# Patient Record
Sex: Male | Born: 1970 | Race: White | Hispanic: No | Marital: Married | Smoking: Current some day smoker
Health system: Southern US, Community
[De-identification: ages and names within clinical notes are randomized; demographics above are authoritative.]

## PROBLEM LIST (undated history)

## (undated) DIAGNOSIS — K219 Gastro-esophageal reflux disease without esophagitis: Secondary | ICD-10-CM

---

## 2008-09-08 ENCOUNTER — Ambulatory Visit: Payer: Self-pay | Admitting: Gastroenterology

## 2012-08-21 ENCOUNTER — Ambulatory Visit: Payer: Self-pay | Admitting: Family Medicine

## 2012-08-22 ENCOUNTER — Emergency Department: Payer: Self-pay | Admitting: Emergency Medicine

## 2012-08-29 LAB — PSA

## 2012-09-21 ENCOUNTER — Ambulatory Visit: Payer: Self-pay | Admitting: Medical

## 2013-05-11 ENCOUNTER — Ambulatory Visit: Payer: Self-pay | Admitting: Internal Medicine

## 2014-02-11 ENCOUNTER — Ambulatory Visit: Payer: Self-pay | Admitting: Physician Assistant

## 2014-02-11 LAB — RAPID INFLUENZA A&B ANTIGENS (ARMC ONLY)

## 2014-10-31 ENCOUNTER — Ambulatory Visit: Payer: Self-pay

## 2014-10-31 LAB — COMPREHENSIVE METABOLIC PANEL
Albumin: 4.3 g/dL (ref 3.4–5.0)
Alkaline Phosphatase: 95 U/L
Anion Gap: 9 (ref 7–16)
BUN: 10 mg/dL (ref 7–18)
Bilirubin,Total: 0.5 mg/dL (ref 0.2–1.0)
CO2: 30 mmol/L (ref 21–32)
Calcium, Total: 9.2 mg/dL (ref 8.5–10.1)
Chloride: 99 mmol/L (ref 98–107)
Creatinine: 1.18 mg/dL (ref 0.60–1.30)
EGFR (African American): >60
GLUCOSE: 101 mg/dL — AB (ref 65–99)
Osmolality: 275 (ref 275–301)
Potassium: 4.3 mmol/L (ref 3.5–5.1)
SGOT(AST): 27 U/L (ref 15–37)
SGPT (ALT): 56 U/L
SODIUM: 138 mmol/L (ref 136–145)
TOTAL PROTEIN: 8.2 g/dL (ref 6.4–8.2)

## 2014-10-31 LAB — CBC WITH DIFFERENTIAL/PLATELET
BASOS ABS: 0.1 10*3/uL (ref 0.0–0.1)
Basophil %: 1 %
EOS ABS: 0.1 10*3/uL (ref 0.0–0.7)
Eosinophil %: 1.1 %
HCT: 49.5 % (ref 40.0–52.0)
HGB: 16.7 g/dL (ref 13.0–18.0)
LYMPHS ABS: 2.8 10*3/uL (ref 1.0–3.6)
LYMPHS PCT: 23.9 %
MCH: 29.4 pg (ref 26.0–34.0)
MCHC: 33.7 g/dL (ref 32.0–36.0)
MCV: 87 fL (ref 80–100)
MONO ABS: 1 x10 3/mm (ref 0.2–1.0)
Monocyte %: 8.4 %
NEUTROS ABS: 7.6 10*3/uL — AB (ref 1.4–6.5)
NEUTROS PCT: 65.6 %
Platelet: 270 10*3/uL (ref 150–440)
RBC: 5.67 10*6/uL (ref 4.40–5.90)
RDW: 12.7 % (ref 11.5–14.5)
WBC: 11.5 10*3/uL — ABNORMAL HIGH (ref 3.8–10.6)

## 2014-10-31 LAB — CK: CK, Total: 98 U/L (ref 39–308)

## 2014-10-31 LAB — CK-MB: CK-MB: 0.8 ng/mL (ref 0.5–3.6)

## 2014-10-31 LAB — D-DIMER(ARMC): D-Dimer: 167 ng/ml

## 2015-05-19 ENCOUNTER — Telehealth: Payer: Self-pay

## 2015-06-27 ENCOUNTER — Other Ambulatory Visit: Payer: Self-pay | Admitting: Unknown Physician Specialty

## 2015-08-04 ENCOUNTER — Telehealth: Payer: Self-pay | Admitting: Unknown Physician Specialty

## 2015-08-04 ENCOUNTER — Other Ambulatory Visit: Payer: Self-pay | Admitting: Unknown Physician Specialty

## 2015-08-04 MED ORDER — CITALOPRAM HYDROBROMIDE 20 MG PO TABS: 20.0000 mg | ORAL_TABLET | Freq: Every day | ORAL | Status: DC

## 2015-08-04 NOTE — Telephone Encounter (Signed)
Routing to provider. Patient scheduled appointment for 09/03/15 and pharmacy is [facility].

## 2015-08-04 NOTE — Telephone Encounter (Signed)
Pt needs refill on celexa and would like it go to [facility]. rx refill was denied because he needed to be seen first and he has an appt scheduled for the 30th he had to have it on a Friday morning and that was the closest i had.

## 2015-09-02 DIAGNOSIS — K219 Gastro-esophageal reflux disease without esophagitis: Secondary | ICD-10-CM | POA: Insufficient documentation

## 2015-09-03 ENCOUNTER — Encounter: Payer: Self-pay | Admitting: Unknown Physician Specialty

## 2015-09-03 ENCOUNTER — Ambulatory Visit (INDEPENDENT_AMBULATORY_CARE_PROVIDER_SITE_OTHER): Payer: BLUE CROSS/BLUE SHIELD | Admitting: Unknown Physician Specialty

## 2015-09-03 VITALS — BP 142/88 | HR 72 | Temp 97.8°F | Ht 70.7 in | Wt 219.6 lb

## 2015-09-03 DIAGNOSIS — G4700 Insomnia, unspecified: Secondary | ICD-10-CM | POA: Diagnosis not present

## 2015-09-03 DIAGNOSIS — G4730 Sleep apnea, unspecified: Secondary | ICD-10-CM

## 2015-09-03 DIAGNOSIS — F419 Anxiety disorder, unspecified: Secondary | ICD-10-CM | POA: Diagnosis not present

## 2015-09-03 MED ORDER — CITALOPRAM HYDROBROMIDE 20 MG PO TABS: 20.0000 mg | ORAL_TABLET | Freq: Every day | ORAL | Status: AC

## 2015-09-03 NOTE — Assessment & Plan Note (Signed)
Will send for sleep study.    

## 2015-09-03 NOTE — Progress Notes (Signed)
BP 142/88 mmHg  Pulse 72  Temp(Src) 97.8 F (36.6 C)  Ht 5' 10.7" (1.796 m)  Wt 219 lb 9.6 oz (99.61 kg)  BMI 30.88 kg/m2  SpO2 100%   Subjective:    Patient ID: Luke Cole, male    DOB: 09-Oct-1971, 44 y.o.   MRN: [id number]  HPI: Luke Cole is a 44 y.o. male  Chief Complaint  Patient presents with  . Medication Refill    pt states he needs refill on citalopram  . OTHER    pt states he would like to talk about having a sleep study done   Anxiety: He is has been taking citalopram for about 6 months and feels this is working. GAD 7 is 5. He feels more anxious when he is around larger groups (generally family) and would like to have something for PRN use.  He is compliant with medication and denies missing any doses.   Insomnia: He is not sleeping well. He will sleep for an hour then get up for a few hours before going back to bed. He is told he snores when sleeping and his family would like him to have a sleep study.  Falls asleep if not moving.  Does feel rested in the AM.  Does not have headaches.    Relevant past medical, surgical, family and social history reviewed and updated as indicated. Interim medical history since our last visit reviewed. Allergies and medications reviewed and updated.  Depression screen PHQ 2/9 09/03/2015  Decreased Interest 0  Down, Depressed, Hopeless 0  PHQ - 2 Score 0    Review of Systems  Constitutional: Negative for fever, chills, activity change, appetite change and fatigue.  HENT: Negative for congestion, postnasal drip, rhinorrhea, sinus pressure and sore throat.   Eyes: Negative.  Negative for discharge and redness.  Respiratory: Negative.  Negative for cough, chest tightness and shortness of breath.   Cardiovascular: Negative.  Negative for chest pain, palpitations and leg swelling.  Gastrointestinal: Negative.  Negative for diarrhea and constipation.  Musculoskeletal: Negative.  Negative for myalgias, back  pain, arthralgias and gait problem.  Skin: Negative.  Negative for color change, pallor, rash and wound.  Neurological: Negative for dizziness, light-headedness and headaches.  Psychiatric/Behavioral: Negative.  Negative for confusion. The patient is not nervous/anxious.     Per HPI unless specifically indicated above     Objective:    BP 142/88 mmHg  Pulse 72  Temp(Src) 97.8 F (36.6 C)  Ht 5' 10.7" (1.796 m)  Wt 219 lb 9.6 oz (99.61 kg)  BMI 30.88 kg/m2  SpO2 100%  Wt Readings from Last 3 Encounters:  09/03/15 219 lb 9.6 oz (99.61 kg)  03/19/15 207 lb (93.895 kg)    Physical Exam  Constitutional: He is oriented to person, place, and time. He appears well-developed and well-nourished. No distress.  HENT:  Head: Normocephalic and atraumatic.  Eyes: Conjunctivae are normal.  Neck: Normal range of motion.  Cardiovascular: Normal rate, regular rhythm and normal heart sounds.  Exam reveals no gallop and no friction rub.   No murmur heard. Pulmonary/Chest: Effort normal and breath sounds normal. No respiratory distress. He has no wheezes. He has no rales. He exhibits no tenderness.  Musculoskeletal: Normal range of motion. He exhibits no edema or tenderness.  Neurological: He is alert and oriented to person, place, and time.  Skin: Skin is warm and dry. No rash noted. He is not diaphoretic. No erythema. No pallor.  Psychiatric: He has  a normal mood and affect. His behavior is normal. Judgment and thought content normal.        Assessment & Plan:   Problem List Items Addressed This Visit      Unprioritized   Insomnia - Primary    Will send for sleep study      Anxiety   Relevant Medications   citalopram (CELEXA) 20 MG tablet    Other Visit Diagnoses    Sleep apnea        Relevant Orders    Ambulatory referral to Sleep Studies        Follow up plan: Return for schedule physical.

## 2015-10-25 ENCOUNTER — Encounter: Payer: Self-pay | Admitting: Unknown Physician Specialty

## 2015-10-25 DIAGNOSIS — G4730 Sleep apnea, unspecified: Secondary | ICD-10-CM | POA: Insufficient documentation

## 2015-11-09 NOTE — Telephone Encounter (Signed)
done

## 2015-12-10 ENCOUNTER — Encounter: Payer: BLUE CROSS/BLUE SHIELD | Admitting: Unknown Physician Specialty

## 2016-02-19 ENCOUNTER — Encounter: Payer: Self-pay | Admitting: Emergency Medicine

## 2016-02-19 ENCOUNTER — Emergency Department: Payer: BLUE CROSS/BLUE SHIELD

## 2016-02-19 ENCOUNTER — Emergency Department
Admission: EM | Admit: 2016-02-19 | Discharge: 2016-02-20 | Disposition: A | Discharge status: Home / Self Care | Payer: BLUE CROSS/BLUE SHIELD | Attending: Emergency Medicine | Admitting: Emergency Medicine

## 2016-02-19 DIAGNOSIS — Z79899 Other long term (current) drug therapy: Secondary | ICD-10-CM | POA: Insufficient documentation

## 2016-02-19 DIAGNOSIS — R451 Restlessness and agitation: Secondary | ICD-10-CM | POA: Insufficient documentation

## 2016-02-19 DIAGNOSIS — F17200 Nicotine dependence, unspecified, uncomplicated: Secondary | ICD-10-CM | POA: Diagnosis not present

## 2016-02-19 DIAGNOSIS — R45851 Suicidal ideations: Secondary | ICD-10-CM | POA: Diagnosis not present

## 2016-02-19 DIAGNOSIS — Z046 Encounter for general psychiatric examination, requested by authority: Secondary | ICD-10-CM | POA: Diagnosis present

## 2016-02-19 DIAGNOSIS — F4324 Adjustment disorder with disturbance of conduct: Secondary | ICD-10-CM

## 2016-02-19 DIAGNOSIS — F1020 Alcohol dependence, uncomplicated: Secondary | ICD-10-CM

## 2016-02-19 HISTORY — DX: Gastro-esophageal reflux disease without esophagitis: K21.9

## 2016-02-19 LAB — COMPREHENSIVE METABOLIC PANEL
ALT: 33 U/L (ref 17–63)
AST: 29 U/L (ref 15–41)
Albumin: 4.8 g/dL (ref 3.5–5.0)
Alkaline Phosphatase: 65 U/L (ref 38–126)
Anion gap: 4 — ABNORMAL LOW (ref 5–15)
BILIRUBIN TOTAL: 0.7 mg/dL (ref 0.3–1.2)
BUN: 8 mg/dL (ref 6–20)
CHLORIDE: 109 mmol/L (ref 101–111)
CO2: 27 mmol/L (ref 22–32)
Calcium: 10.2 mg/dL (ref 8.9–10.3)
Creatinine, Ser: 0.82 mg/dL (ref 0.61–1.24)
Glucose, Bld: 107 mg/dL — ABNORMAL HIGH (ref 65–99)
POTASSIUM: 4.2 mmol/L (ref 3.5–5.1)
Sodium: 140 mmol/L (ref 135–145)
TOTAL PROTEIN: 8 g/dL (ref 6.5–8.1)

## 2016-02-19 LAB — CBC
HCT: 46.3 % (ref 40.0–52.0)
Hemoglobin: 15.8 g/dL (ref 13.0–18.0)
MCH: 30.2 pg (ref 26.0–34.0)
MCHC: 34.2 g/dL (ref 32.0–36.0)
MCV: 88.2 fL (ref 80.0–100.0)
PLATELETS: 279 10*3/uL (ref 150–440)
RBC: 5.25 MIL/uL (ref 4.40–5.90)
RDW: 13.2 % (ref 11.5–14.5)
WBC: 8.3 10*3/uL (ref 3.8–10.6)

## 2016-02-19 LAB — URINE DRUG SCREEN, QUALITATIVE (ARMC ONLY)
AMPHETAMINES, UR SCREEN: NOT DETECTED
BENZODIAZEPINE, UR SCRN: NOT DETECTED
Barbiturates, Ur Screen: NOT DETECTED
Cannabinoid 50 Ng, Ur ~~LOC~~: NOT DETECTED
Cocaine Metabolite,Ur ~~LOC~~: NOT DETECTED
MDMA (ECSTASY) UR SCREEN: NOT DETECTED
Methadone Scn, Ur: NOT DETECTED
Opiate, Ur Screen: NOT DETECTED
PHENCYCLIDINE (PCP) UR S: NOT DETECTED
TRICYCLIC, UR SCREEN: NOT DETECTED

## 2016-02-19 LAB — ETHANOL

## 2016-02-19 LAB — ACETAMINOPHEN LEVEL: Acetaminophen (Tylenol), Serum: <10 ug/mL — ABNORMAL LOW (ref 10–30)

## 2016-02-19 LAB — SALICYLATE LEVEL

## 2016-02-19 NOTE — ED Notes (Signed)
Patient transported to X-ray 

## 2016-02-19 NOTE — ED Notes (Signed)
Patient presents to the ED with police under involuntary commitment.  Patient got upset earlier today when speaking to his wife, showed her a gun, and told her that, "with one pull he could end his life."  When this RN asked patient why he is in the ED today patient states, "I guess I'm mental."  When asked about SI, patient states, "I guess."  Patient's wife states patient has been under a large amount of stress lately.

## 2016-02-19 NOTE — ED Provider Notes (Signed)
[facility] Emergency Department Provider Note  ____________________________________________  Time seen: Approximately 6:30 PM  I have reviewed the triage vital signs and the nursing notes.   HISTORY  Chief Complaint Psychiatric Evaluation    HPI Luke Cole is a 45 y.o. male reports that he went out tricking last night, he got an argument with his wife this morning and then went to his truck and walked out with a right full between there and the house when his wife called 911. Reports he is having some suicidal thoughts. Denies trying or wanting to harm anyone else, though he was upset with his wife.  He does report he uses alcohol occasionally, none today. No history of withdrawals and is not a daily drinker but drank a fair amount last night.  Denies any recent illness except for a slight cough which she's had for 3 weeks without fever. Nonproductive.Denies shortness of breath.   Past Medical History  Diagnosis Date  . GERD (gastroesophageal reflux disease)     Patient Active Problem List   Diagnosis Date Noted  . Sleep apnea 10/25/2015  . Insomnia 09/03/2015  . Anxiety 09/03/2015  . GERD (gastroesophageal reflux disease) 09/02/2015    History reviewed. No pertinent past surgical history.  Current Outpatient Rx  Name  Route  Sig  Dispense  Refill  . citalopram (CELEXA) 20 MG tablet   Oral   Take 1 tablet (20 mg total) by mouth daily.   90 tablet   3   . esomeprazole (NEXIUM) 40 MG capsule   Oral   Take 40 mg by mouth daily at 12 noon.           Allergies Bactrim  Family History  Problem Relation Age of Onset  . Arthritis Father   . Heart disease Father     Social History Social History  Substance Use Topics  . Smoking status: Current Some Day Smoker  . Smokeless tobacco: Current User  . Alcohol Use: 4.2 oz/week    7 Cans of beer, 0 Standard drinks or equivalent per week    Review of Systems Constitutional:  No fever/chills Eyes: No visual changes. ENT: No sore throat. Cardiovascular: Denies chest pain. Respiratory: Denies shortness of breath. Has had a cough for 3 weeks, nonproductive. These could be from smoking. Gastrointestinal: No abdominal pain.  No nausea, no vomiting.  No diarrhea.  No constipation. Genitourinary: Negative for dysuria. Musculoskeletal: Negative for back pain. Skin: Negative for rash. Neurological: Negative for headaches, focal weakness or numbness.  10-point ROS otherwise negative.  ____________________________________________   PHYSICAL EXAM:  VITAL SIGNS: ED Triage Vitals  Enc Vitals Group     BP 02/19/16 1733 137/95 mmHg     Pulse Rate 02/19/16 1733 82     Resp 02/19/16 1733 18     Temp 02/19/16 1733 98.7 F (37.1 C)     Temp Source 02/19/16 1733 Oral     SpO2 02/19/16 1733 98 %     Weight 02/19/16 1733 195 lb (88.451 kg)     Height 02/19/16 1733 6' (1.829 m)     Head Cir --      Peak Flow --      Pain Score 02/19/16 1734 0     Pain Loc --      Pain Edu? --      Excl. in GC? --    Constitutional: Alert and oriented. Well appearing and in no acute distress. Eyes: Conjunctivae are normal. PERRL. EOMI. Head:  Atraumatic. Nose: No congestion/rhinnorhea. Mouth/Throat: Mucous membranes are moist.  Oropharynx non-erythematous. Neck: No stridor.   Cardiovascular: Normal rate, regular rhythm. Grossly normal heart sounds.  Good peripheral circulation. Respiratory: Normal respiratory effort.  No retractions. Lungs CTAB. Gastrointestinal: Soft and nontender. No distention.  Musculoskeletal: No lower extremity tenderness nor edema.  No joint effusions. Neurologic:  Normal speech and language. No gross focal neurologic deficits are appreciated. No gait instability. Skin:  Skin is warm, dry and intact. No rash noted. Psychiatric: Mood and affect are slightly agitated. Speech and behavior are normal.  ____________________________________________    LABS (all labs ordered are listed, but only abnormal results are displayed)  Labs Reviewed  COMPREHENSIVE METABOLIC PANEL - Abnormal; Notable for the following:    Glucose, Bld 107 (*)    Anion gap 4 (*)    All other components within normal limits  ACETAMINOPHEN LEVEL - Abnormal; Notable for the following:    Acetaminophen (Tylenol), Serum <10 (*)    All other components within normal limits  ETHANOL  SALICYLATE LEVEL  CBC  URINE DRUG SCREEN, QUALITATIVE ([facility] ONLY)   ____________________________________________  EKG   ____________________________________________  RADIOLOGY  DG Chest 2 View (Final result) Result time: 02/19/16 19:47:26   Final result by Rad Results In Interface (02/19/16 19:47:26)   Narrative:   CLINICAL DATA: Cough for 3 weeks  EXAM: CHEST 2 VIEW  COMPARISON: 10/31/2014  FINDINGS: The heart size and vascular pattern are normal. Lungs are clear. No pleural effusion.  IMPRESSION: No active cardiopulmonary disease.   Electronically Signed By: Esperanza Heiraymond Rubner M.D. On: 02/19/2016 19:47       ____________________________________________   PROCEDURES  Procedure(s) performed: None  Critical Care performed: No  ____________________________________________   INITIAL IMPRESSION / ASSESSMENT AND PLAN / ED COURSE  Pertinent labs & imaging results that were available during my care of the patient were reviewed by me and considered in my medical decision making (see chart for details).  Patient presents for suicidal ideation. Denies attempt, but he was notably brandishing a weapon at some point with suicidal statements. He is on involuntary commitment. Medically he does complain of a slight cough, denies overdose or ingestion. His hemodynamics are stable. Lungs are clear. I will obtain chest x-ray to evaluate for pneumonia or cause of chronic cough,which is possibly due to smoking.  ----------------------------------------- 10:21  PM on 02/19/2016 -----------------------------------------  Patient medically cleared at this time, no evidence of acute emergent medical problem. He will remain on involuntary commitment, psychiatry consult pending at this time. Patient does report suicidal ideation. ____________________________________________   FINAL CLINICAL IMPRESSION(S) / ED DIAGNOSES  Final diagnoses:  Suicidal ideation      Sharyn CreamerMark Yates Weisgerber, MD 02/19/16 2221

## 2016-02-20 DIAGNOSIS — F17200 Nicotine dependence, unspecified, uncomplicated: Secondary | ICD-10-CM

## 2016-02-20 DIAGNOSIS — F4324 Adjustment disorder with disturbance of conduct: Secondary | ICD-10-CM | POA: Diagnosis not present

## 2016-02-20 DIAGNOSIS — F1020 Alcohol dependence, uncomplicated: Secondary | ICD-10-CM

## 2016-02-20 NOTE — BH Assessment (Signed)
Assessment Note  Luke Cole is an 45 y.o. male presenting to the the ED under IVC, initiated by his wife, Luke Cole [phone]).  Patient says he went out "tricking" last night and having a good time with his wife.  Pt stated he had too much to drink and wanted to talk to his wife about the events that happened last night.  Pt became very agitated because his wife did not want to talk to him.  Per the IVC, pt decided to leave and as he was leaving, pt showed his wife his gun and stated that with one pull he could end his life.  Pt admits to drinking too much but denies being suicidal or homicidal.  Pt denies any illicit drug use.  Diagnosis: Alcohol Intoxication  Past Medical History:  Past Medical History  Diagnosis Date  . GERD (gastroesophageal reflux disease)     History reviewed. No pertinent past surgical history.  Family History:  Family History  Problem Relation Age of Onset  . Arthritis Father   . Heart disease Father     Social History:  reports that he has been smoking.  He uses smokeless tobacco. He reports that he drinks about 4.2 oz of alcohol per week. He reports that he does not use illicit drugs.  Additional Social History:  Alcohol / Drug Use History of alcohol / drug use?: No history of alcohol / drug abuse  CIWA: CIWA-Ar BP: 101/60 mmHg Pulse Rate: 70 COWS:    Allergies:  Allergies  Allergen Reactions  . Bactrim [Sulfamethoxazole-Trimethoprim] Rash    Home Medications:  (Not in a hospital admission)  OB/GYN Status:  No LMP for male patient.  General Assessment Data [facility] Assessment: In system Is this a Tele or Face-to-Face Assessment?: Face-to-Face Is this an Initial Assessment or a Re-assessment for this encounter?: Initial Assessment Marital status: Married Taylor MillMaiden name: N/A Is patient pregnant?: No Pregnancy Status: No Living Arrangements: Spouse/significant other Can pt return to current  living arrangement?: Yes Admission Status: Involuntary Is patient capable of signing voluntary admission?: No Referral Source: Self/Family/Friend Insurance type: BCBS     Crisis Care Plan Living Arrangements: Spouse/significant other Legal Guardian: Other: (self) Name of Psychiatrist: None reported Name of Therapist: None reported  Education Status Is patient currently in school?: No Current Grade: N/A Highest grade of school patient has completed: 12th Name of school: N/A Contact person: N/A  Risk to self with the past 6 months Suicidal Ideation: Yes-Currently Present Has patient been a risk to self within the past 6 months prior to admission? : No Suicidal Intent: No Has patient had any suicidal intent within the past 6 months prior to admission? : No Is patient at risk for suicide?: No Suicidal Plan?: No (Pt denies) Has patient had any suicidal plan within the past 6 months prior to admission? : No Access to Means: Yes Specify Access to Suicidal Means: Patient has a gun What has been your use of drugs/alcohol within the last 12 months?: ETOH Previous Attempts/Gestures: No How many times?: 0 Other Self Harm Risks: None reported Triggers for Past Attempts: Unknown Intentional Self Injurious Behavior: None Family Suicide History: No Recent stressful life event(s): Conflict (Comment) (Conflict with wife) Persecutory voices/beliefs?: No Depression: No Substance abuse history and/or treatment for substance abuse?: No Suicide prevention information given to non-admitted patients: Not applicable  Risk to Others within the past 6 months Homicidal Ideation: No Does patient have any lifetime risk of violence  toward others beyond the six months prior to admission? : No Thoughts of Harm to Others: No Current Homicidal Intent: No Current Homicidal Plan: No Access to Homicidal Means: No Identified Victim: None identified History of harm to others?: No Assessment of Violence:  None Noted Violent Behavior Description: None identified Does patient have access to weapons?: Yes (Comment) Criminal Charges Pending?: No Does patient have a court date: No Is patient on probation?: No  Psychosis Hallucinations: None noted Delusions: None noted  Mental Status Report Appearance/Hygiene: In scrubs Eye Contact: Fair Motor Activity: Freedom of movement Speech: Slurred Level of Consciousness: Drowsy Mood: Ambivalent Affect: Appropriate to circumstance Anxiety Level: None Thought Processes: Coherent, Relevant Judgement: Partial Orientation: Person, Place, Time, Situation Obsessive Compulsive Thoughts/Behaviors: None  Cognitive Functioning Concentration: Normal Memory: Recent Intact, Remote Intact IQ: Average Insight: Fair Impulse Control: Fair Appetite: Fair Weight Loss: 0 Weight Gain: 0 Sleep: No Change Total Hours of Sleep: 8 Vegetative Symptoms: None  ADLScreening [facility] Assessment Services) Patient's cognitive ability adequate to safely complete daily activities?: Yes Patient able to express need for assistance with ADLs?: Yes Independently performs ADLs?: Yes (appropriate for developmental age)  Prior Inpatient Therapy Prior Inpatient Therapy: No Prior Therapy Dates: N/A Prior Therapy Facilty/Provider(s): N/A Reason for Treatment: N/A  Prior Outpatient Therapy Prior Outpatient Therapy: No Prior Therapy Dates: N/A Prior Therapy Facilty/Provider(s): N/A Reason for Treatment: N/A Does patient have an ACCT team?: No Does patient have Intensive In-House Services?  : No Does patient have [facility] services? : No Does patient have P4CC services?: No  ADL Screening (condition at time of admission) Patient's cognitive ability adequate to safely complete daily activities?: Yes Patient able to express need for assistance with ADLs?: Yes Independently performs ADLs?: Yes (appropriate for developmental age)       Abuse/Neglect Assessment (Assessment  to be complete while patient is alone) Physical Abuse: Denies Verbal Abuse: Denies Sexual Abuse: Denies Exploitation of patient/patient's resources: Denies Self-Neglect: Denies Values / Beliefs Cultural Requests During Hospitalization: None Spiritual Requests During Hospitalization: None Consults Spiritual Care Consult Needed: No Social Work Consult Needed: No Merchant navy officer (For Healthcare) Does patient have an advance directive?: No Would patient like information on creating an advanced directive?: No - patient declined information    Additional Information 1:1 In Past 12 Months?: No CIRT Risk: No Elopement Risk: No Does patient have medical clearance?: Yes     Disposition:  Disposition Initial Assessment Completed for this Encounter: Yes Disposition of Patient: Other dispositions Other disposition(s): Other (Comment) (Psych MD consult)  On Site Evaluation by:   Reviewed with Physician:    [place] 02/20/2016 12:38 AM

## 2016-02-20 NOTE — Consult Note (Signed)
[facility] Consult   Reason for Consult:  SI Referring Physician:  ER Patient Identification: Luke Cole MRN:  [id number] Principal Diagnosis: Adjustment disorder with disturbance of conduct Diagnosis:   Patient Active Problem List   Diagnosis Date Noted  . Alcohol use disorder, severe, dependence ([place]) [F10.20] 02/20/2016  . Tobacco use disorder [F17.200] 02/20/2016  . Adjustment disorder with disturbance of conduct [F43.24] 02/20/2016  . Sleep apnea [G47.30] 10/25/2015  . Insomnia [G47.00] 09/03/2015  . GERD (gastroesophageal reflux disease) [K21.9] 09/02/2015    Total Time spent with patient: 1 hour  Subjective:   Luke Cole is a 45 y.o. male patient admitted with SI.  HPI:   [name] is an 45 y.o. male presenting to the the ED under IVC, initiated by his wife, Luke Cole [phone]). Patient says he went out "tricking" last night and having a good time with his wife. Pt stated he had too much to drink and wanted to talk to his wife about the events that happened last night. Pt became very agitated because his wife did not want to talk to him. Per the IVC, pt decided to leave and as he was leaving, pt showed his wife his gun and stated that with one pull he could end his life. Pt admits to drinking too much but denies being suicidal or homicidal. Pt denies any illicit drug use.  At arrival to ER alcohol level was below detection and u tox was neg.  Today he reports he is having issues with his mood which he attributes to alcohol use.  He denies symptoms of MDD as he says he does well when not drinking.  He wants to address his issues with alcohol and is requesting outpt treatment for it.  Pt says that his wife is also drinking heavealy and they are frequently arguing. Patient denies any desire to harm himself or anyone else.  Denies hallucinations or having h/o aggression. Nurse spoke with wife she confriems all guns  were removed from the house.  Family is supportive of him and they don't have any concerns about his safety if he were discharged today.    Pt says his wife also takes pain medication as she recently had a back injury.  Substance abuse: drinks half a pint 3 times per week.  Smokes a few times per week.  Denies any h/o withdrawal from alcohol.  Denies use of any other substances or prescription medications.   Past Psychiatric History: pt reports 1 prior hospitalization at age 73 for an OD.  No psychiatric treatment after that.  He saw his PCP for mood instability and was prescribed with citalopram.  Pt reports no improvement with citalopram.  Does not want to continue taking it.   Risk to Self: Suicidal Ideation: Yes-Currently Present Suicidal Intent: No Is patient at risk for suicide?: No Suicidal Plan?: No (Pt denies) Access to Means: Yes Specify Access to Suicidal Means: Patient has a gun What has been your use of drugs/alcohol within the last 12 months?: ETOH How many times?: 0 Other Self Harm Risks: None reported Triggers for Past Attempts: Unknown Intentional Self Injurious Behavior: None Risk to Others: Homicidal Ideation: No Thoughts of Harm to Others: No Current Homicidal Intent: No Current Homicidal Plan: No Access to Homicidal Means: No Identified Victim: None identified History of harm to others?: No Assessment of Violence: None Noted Violent Behavior Description: None identified Does patient have access to weapons?: Yes (Comment) Criminal Charges Pending?: No Does  patient have a court date: No Prior Inpatient Therapy: Prior Inpatient Therapy: No Prior Therapy Dates: N/A Prior Therapy Facilty/Provider(s): N/A Reason for Treatment: N/A Prior Outpatient Therapy: Prior Outpatient Therapy: No Prior Therapy Dates: N/A Prior Therapy Facilty/Provider(s): N/A Reason for Treatment: N/A Does patient have an ACCT team?: No Does patient have Intensive In-House Services?  :  No Does patient have [facility] services? : No Does patient have P4CC services?: No  Past Medical History: no history of head injury or seizures. Past Medical History  Diagnosis Date  . GERD (gastroesophageal reflux disease)    History reviewed. No pertinent past surgical history.  Family History:  Family History  Problem Relation Age of Onset  . Arthritis Father   . Heart disease Father    Family Psychiatric  History: alcoholism in his father and brother.  1st cousin recently committed suicide recently.  There were not closed.  Social History: lives with wife and two of their 3 children (2 minors and 1 adult). 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  Denies legal history.  College education History  Alcohol Use  . 4.2 oz/week  . 7 Cans of beer, 0 Standard drinks or equivalent per week     History  Drug Use No    Social History   Social History  . Marital Status: Married    Spouse Name: N/A  . Number of Children: N/A  . Years of Education: N/A   Social History Main Topics  . Smoking status: Current Some Day Smoker  . Smokeless tobacco: Current User  . Alcohol Use: 4.2 oz/week    7 Cans of beer, 0 Standard drinks or equivalent per week  . Drug Use: No  . Sexual Activity: Not Asked   Other Topics Concern  . None   Social History Narrative       Allergies:   Allergies  Allergen Reactions  . Bactrim [Sulfamethoxazole-Trimethoprim] Rash    Labs:  Results for orders placed or performed during the hospital encounter of 02/19/16 (from the past 48 hour(s))  Comprehensive metabolic panel     Status: Abnormal   Collection Time: 02/19/16  5:37 PM  Result Value Ref Range   Sodium 140 135 - 145 mmol/L   Potassium 4.2 3.5 - 5.1 mmol/L   Chloride 109 101 - 111 mmol/L   CO2 27 22 - 32 mmol/L   Glucose, Bld 107 (H) 65 - 99 mg/dL   BUN 8 6 - 20 mg/dL   Creatinine, Ser 0.82 0.61 - 1.24 mg/dL   Calcium 10.2 8.9 - 10.3 mg/dL   Total Protein  8.0 6.5 - 8.1 g/dL   Albumin 4.8 3.5 - 5.0 g/dL   AST 29 15 - 41 U/L   ALT 33 17 - 63 U/L   Alkaline Phosphatase 65 38 - 126 U/L   Total Bilirubin 0.7 0.3 - 1.2 mg/dL   GFR calc non Af Amer >60 >60 mL/min   GFR calc Af Amer >60 >60 mL/min    Comment: (NOTE) The eGFR has been calculated using the CKD EPI equation. This calculation has not been validated in all clinical situations. eGFR's persistently <60 mL/min signify possible Chronic Kidney Disease.    Anion gap 4 (L) 5 - 15  Ethanol (ETOH)     Status: None   Collection Time: 02/19/16  5:37 PM  Result Value Ref Range   Alcohol, Ethyl (B) <5 <5 mg/dL    Comment:  LOWEST DETECTABLE LIMIT FOR SERUM ALCOHOL IS 5 mg/dL FOR MEDICAL PURPOSES ONLY   Salicylate level     Status: None   Collection Time: 02/19/16  5:37 PM  Result Value Ref Range   Salicylate Lvl <4.1 2.8 - 30.0 mg/dL  Acetaminophen level     Status: Abnormal   Collection Time: 02/19/16  5:37 PM  Result Value Ref Range   Acetaminophen (Tylenol), Serum <10 (L) 10 - 30 ug/mL    Comment:        THERAPEUTIC CONCENTRATIONS VARY SIGNIFICANTLY. A RANGE OF 10-30 ug/mL MAY BE AN EFFECTIVE CONCENTRATION FOR MANY PATIENTS. HOWEVER, SOME ARE BEST TREATED AT CONCENTRATIONS OUTSIDE THIS RANGE. ACETAMINOPHEN CONCENTRATIONS >150 ug/mL AT 4 HOURS AFTER INGESTION AND >50 ug/mL AT 12 HOURS AFTER INGESTION ARE OFTEN ASSOCIATED WITH TOXIC REACTIONS.   CBC     Status: None   Collection Time: 02/19/16  5:37 PM  Result Value Ref Range   WBC 8.3 3.8 - 10.6 K/uL   RBC 5.25 4.40 - 5.90 MIL/uL   Hemoglobin 15.8 13.0 - 18.0 g/dL   HCT 46.3 40.0 - 52.0 %   MCV 88.2 80.0 - 100.0 fL   MCH 30.2 26.0 - 34.0 pg   MCHC 34.2 32.0 - 36.0 g/dL   RDW 13.2 11.5 - 14.5 %   Platelets 279 150 - 440 K/uL  Urine Drug Screen, Qualitative ([facility] only)     Status: None   Collection Time: 02/19/16  5:37 PM  Result Value Ref Range   Tricyclic, Ur Screen NONE DETECTED NONE DETECTED    Amphetamines, Ur Screen NONE DETECTED NONE DETECTED   MDMA (Ecstasy)Ur Screen NONE DETECTED NONE DETECTED   Cocaine Metabolite,Ur [place] NONE DETECTED NONE DETECTED   Opiate, Ur Screen NONE DETECTED NONE DETECTED   Phencyclidine (PCP) Ur S NONE DETECTED NONE DETECTED   Cannabinoid 50 Ng, Ur [place] NONE DETECTED NONE DETECTED   Barbiturates, Ur Screen NONE DETECTED NONE DETECTED   Benzodiazepine, Ur Scrn NONE DETECTED NONE DETECTED   Methadone Scn, Ur NONE DETECTED NONE DETECTED    Comment: (NOTE) 030  Tricyclics, urine               Cutoff 1000 ng/mL 200  Amphetamines, urine             Cutoff 1000 ng/mL 300  MDMA (Ecstasy), urine           Cutoff 500 ng/mL 400  Cocaine Metabolite, urine       Cutoff 300 ng/mL 500  Opiate, urine                   Cutoff 300 ng/mL 600  Phencyclidine (PCP), urine      Cutoff 25 ng/mL 700  Cannabinoid, urine              Cutoff 50 ng/mL 800  Barbiturates, urine             Cutoff 200 ng/mL 900  Benzodiazepine, urine           Cutoff 200 ng/mL 1000 Methadone, urine                Cutoff 300 ng/mL 1100 1200 The urine drug screen provides only a preliminary, unconfirmed 1300 analytical test result and should not be used for non-medical 1400 purposes. Clinical consideration and professional judgment should 1500 be applied to any positive drug screen result due to possible 1600 interfering substances. A more specific alternate chemical method 1700 must be used  in order to obtain a confirmed analytical result.  1800 Gas chromato graphy / mass spectrometry (GC/MS) is the preferred 1900 confirmatory method.     No current facility-administered medications for this encounter.   Current Outpatient Prescriptions  Medication Sig Dispense Refill  . citalopram (CELEXA) 20 MG tablet Take 1 tablet (20 mg total) by mouth daily. 90 tablet 3    Musculoskeletal: Strength & Muscle Tone: within normal limits Gait & Station: normal Patient leans: N/A  Psychiatric  Specialty Exam: Review of Systems  Constitutional: Negative.   HENT: Negative.   Eyes: Negative.   Respiratory: Negative.   Cardiovascular: Negative.   Gastrointestinal: Negative.   Genitourinary: Negative.   Musculoskeletal: Negative.   Skin: Negative.   Neurological: Negative.   Endo/Heme/Allergies: Negative.   Psychiatric/Behavioral: Positive for substance abuse.    Blood pressure 108/68, pulse 81, temperature 97.8 F (36.6 C), temperature source Oral, resp. rate 16, height 6' (1.829 m), weight 88.451 kg (195 lb), SpO2 97 %.Body mass index is 26.44 kg/(m^2).  General Appearance: Well Groomed  Engineer, water::  Good  Speech:  Clear and Coherent  Volume:  Normal  Mood:  Anxious  Affect:  Appropriate  Thought Process:  Linear  Orientation:  Full (Time, Place, and Person)  Thought Content:  Hallucinations: None  Suicidal Thoughts:  No  Homicidal Thoughts:  No  Memory:  Immediate;   Good Recent;   Good Remote;   Good  Judgement:  Fair  Insight:  Fair  Psychomotor Activity:  Normal  Concentration:  Good  Recall:  Good  Fund of Knowledge:Good  Language: Good  Akathisia:  No  Handed:    AIMS (if indicated):     Assets:  Agricultural consultant Housing Social Support  ADL's:  Intact  Cognition: WNL  Sleep:      Treatment Plan Summary: Daily contact with patient to assess and evaluate symptoms and progress in treatment and Medication management   Pt will be d/c back home SW has provided the pt with resources for him and his family for psychiatric and substance abuse treatment.  Disposition: No evidence of imminent risk to self or others at present.   Patient does not meet criteria for psychiatric inpatient admission.  Hildred Priest, MD 02/20/2016 10:48 AM

## 2016-02-20 NOTE — ED Provider Notes (Signed)
Patient cleared for discharge by Dr. Ardyth HarpsHernandez of psychiatry  Jene Everyobert Makensie Mulhall, MD 02/20/16 1115

## 2016-02-20 NOTE — Progress Notes (Signed)
LCSW met with patient. He reports that he understands he has mood swings and that when he drinks things become escalated. He has insight that he needs to quit drinking. Patient is agreeable to attend an outpatient SA program and the family  members will pick and chose the program that best meets their needs. LCSW created a booklet of resources, [facility], [employer] [facility], [facility], [facility] detox, suboxine Clinic and [place] [facility], counselling. LCSW provided AA and [facility] groups meeting lists for patient and family members. Patient will be discharged. No further needs.  [employer] LCSW [phone]

## 2016-02-20 NOTE — ED Notes (Signed)
Pt up eating breakfast  

## 2016-02-20 NOTE — ED Notes (Signed)
Patient discharged ambulatory to home. Denies SI or HI. Discharge instructions reviewed with patient, he verbalizes understanding. Patient received copy of DC plan and all personal belongings.

## 2016-02-20 NOTE — ED Notes (Signed)

## 2016-02-20 NOTE — ED Notes (Signed)
Patient denies SI or HI.  Patient spoke with wife on the phone. Patient reports conversation went well.  Patient took a shower.  Will continue to monitor q 15 minutes.

## 2016-02-20 NOTE — Discharge Instructions (Signed)
Suicidal Feelings: How to Help Yourself °Suicide is the taking of one's own life. If you feel as though life is getting too tough to handle and are thinking about suicide, get help right away. To get help: °· Call your local emergency services (911 in the [place] °· Call a suicide hotline to speak with a trained counselor who understands how you are feeling. The following is a list of suicide hotlines in the [place]. For a list of hotlines in [place], visit www.suicide.org/hotlines/international/[place]-suicide-hotlines.html. °¨  1-800-273-TALK (1-800-273-8255). °¨  1-800-SUICIDE (1-800-784-2433). °¨  1-888-628-9454. This is a hotline for Spanish speakers. °¨  1-800-799-4TTY (1-800-799-4889). This is a hotline for TTY users. °¨  1-866-4-U-TREVOR (1-866-488-7386). This is a hotline for lesbian, gay, bisexual, transgender, or questioning youth. °· Contact a crisis center or a local suicide prevention center. To find a crisis center or suicide prevention center: °¨ Call your local hospital, clinic, community service organization, mental health center, social service provider, or health department. Ask for assistance in connecting to a crisis center. °¨ Visit www.suicidepreventionlifeline.org/getinvolved/locator for a list of crisis centers in the [place], or visit www.suicideprevention.[place]/thinking-about-suicide/find-a-crisis-centre for a list of centers in [place]. °· Visit the following websites: °¨  National Suicide Prevention Lifeline: www.suicidepreventionlifeline.org °¨  Hopeline: www.hopeline.com °¨  American Foundation for Suicide Prevention: www.afsp.org °¨  The Trevor Project (for lesbian, gay, bisexual, transgender, or questioning youth): www.thetrevorproject.org °HOW CAN I HELP MYSELF FEEL BETTER? °· Promise yourself that you will not do anything drastic when you have suicidal feelings. Remember, there is hope. Many people have gotten through suicidal thoughts and feelings, and you will, too. You may  have gotten through them before, and this proves that you can get through them again. °· Let family, friends, teachers, or counselors know how you are feeling. Try not to isolate yourself from those who care about you. Remember, they will want to help you. Talk with someone every day, even if you do not feel sociable. Face-to-face conversation is best. °· Call a mental health professional and see one regularly. °· Visit your primary health care provider every year. °· Eat a well-balanced diet, and space your meals so you eat regularly. °· Get plenty of rest. °· Avoid alcohol and drugs, and remove them from your home. They will only make you feel worse. °· If you are thinking of taking a lot of medicine, give your medicine to someone who can give it to you one day at a time. If you are on antidepressants and are concerned you will overdose, let your health care provider know so he or she can give you safer medicines. Ask your mental health professional about the possible side effects of any medicines you are taking. °· Remove weapons, poisons, knives, and anything else that could harm you from your home. °· Try to stick to routines. Follow a schedule every day. Put self-care on your schedule. °· Make a list of realistic goals, and cross them off when you achieve them. Accomplishments give a sense of worth. °· Wait until you are feeling better before doing the things you find difficult or unpleasant. °· Exercise if you are able. You will feel better if you exercise for even a half hour each day. °· Go out in the sun or into nature. This will help you recover from depression faster. If you have a favorite place to walk, go there. °· Do the things that have always given you pleasure. Play your favorite music, read a good book, paint a picture, play your favorite instrument, or do anything   else that takes your mind off your depression if it is safe to do. °· Keep your living space well lit. °· When you are feeling well,  write yourself a letter about tips and support that you can read when you are not feeling well. °· Remember that life's difficulties can be sorted out with help. Conditions can be treated. You can work on thoughts and strategies that serve you well. °  °This information is not intended to replace advice given to you by your health care provider. Make sure you discuss any questions you have with your health care provider. °  °Document Released: 05/27/2003 Document Revised: 12/11/2014 Document Reviewed: 03/17/2014 °Elsevier Interactive Patient Education ©2016 Elsevier Inc. ° °

## 2016-02-20 NOTE — ED Notes (Addendum)
[phone] wife, Renford Dillsara Esper, called, [phone] (cell), [phone] (home), person states that entire family is "on board with treatment and support for Nadine CountsBob", pt is "sole bread winner for household" so it's important that he return work soon, also "guns have been removed from the house"

## 2016-08-17 ENCOUNTER — Encounter: Payer: Self-pay | Admitting: *Deleted

## 2016-08-17 ENCOUNTER — Ambulatory Visit
Admission: EM | Admit: 2016-08-17 | Discharge: 2016-08-17 | Disposition: A | Discharge status: Home / Self Care | Payer: BLUE CROSS/BLUE SHIELD | Attending: Internal Medicine | Admitting: Internal Medicine

## 2016-08-17 DIAGNOSIS — S61219A Laceration without foreign body of unspecified finger without damage to nail, initial encounter: Secondary | ICD-10-CM | POA: Diagnosis not present

## 2016-08-17 NOTE — ED Provider Notes (Signed)
[facility]    CSN: [id number] Arrival date & time: 08/17/16  1814  First Provider Contact:  None       History   Chief Complaint Chief Complaint  Patient presents with  . Laceration    HPI Baldo AshRobert Emmett Bowron is a 45 y.o. male. He was putting an aerator together this evening and cut his finger on one of the spikes. The cut is at the ulnar aspect of the left second finger PIP, and has a short segment, about 4 mm, that is a little deeper, and bled for a while. He has excellent range of motion and strength in the finger. Bleeding is currently controlled. No other injury reported. He states that his tetanus shot is up-to-date.    HPI  Past Medical History:  Diagnosis Date  . GERD (gastroesophageal reflux disease)     Patient Active Problem List   Diagnosis Date Noted  . Alcohol use disorder, severe, dependence ([facility]) 02/20/2016  . Tobacco use disorder 02/20/2016  . Adjustment disorder with disturbance of conduct 02/20/2016  . Sleep apnea 10/25/2015  . Insomnia 09/03/2015  . GERD (gastroesophageal reflux disease) 09/02/2015    History reviewed. No pertinent surgical history.     Home Medications    Prior to Admission medications   Medication Sig Start Date End Date Taking? Authorizing Provider  citalopram (CELEXA) 20 MG tablet Take 1 tablet (20 mg total) by mouth daily. 09/03/15   Gabriel Cirriheryl Wicker, NP    Family History Family History  Problem Relation Age of Onset  . Arthritis Father   . Heart disease Father     Social History Social History  Substance Use Topics  . Smoking status: Current Some Day Smoker  . Smokeless tobacco: Current User  . Alcohol use 4.2 oz/week    7 Cans of beer per week     Allergies   Bactrim [sulfamethoxazole-trimethoprim]   Review of Systems Review of Systems  All other systems reviewed and are negative.    Physical Exam Triage Vital Signs ED Triage Vitals  Enc Vitals Group     BP 08/17/16 1837 109/76       Pulse Rate 08/17/16 1837 (!) 57     Resp 08/17/16 1837 16     Temp 08/17/16 1837 98 F (36.7 C)     Temp Source 08/17/16 1837 Oral     SpO2 08/17/16 1837 99 %     Weight 08/17/16 1840 180 lb (81.6 kg)     Height 08/17/16 1840 6' (1.829 m)   Updated Vital Signs BP 109/76 (BP Location: Right Arm)   Pulse (!) 57   Temp 98 F (36.7 C) (Oral)   Resp 16   Ht 6' (1.829 m)   Wt 180 lb (81.6 kg)   SpO2 99%   BMI 24.41 kg/m  Physical Exam  Constitutional: He is oriented to person, place, and time. No distress.  Alert, nicely groomed  HENT:  Head: Atraumatic.  Eyes:  Conjugate gaze, no eye redness/drainage  Neck: Neck supple.  Cardiovascular: Normal rate.   Pulmonary/Chest: No respiratory distress.  Abdominal: He exhibits no distension.  Musculoskeletal: Normal range of motion.  Neurological: He is alert and oriented to person, place, and time.  Skin: Skin is warm and dry.  No cyanosis Left second finger, ulnar aspect of PIP with mostly superficial scratch that is a little deeper for 4 mm segment at the PIP. Not gaping. Not actively bleeding. Good strength and range of motion in the  left second finger.  Nursing note and vitals reviewed.    UC Treatments / Results   Procedures Procedures (including critical care time)      Wound was cleaned and evaluated.  Short length: opted for splinting rather than closure.  Antibiotic ointment/bandage and splint applied per nursing staff.  Recheck for any increasing redness/swelling/pain/drainbage or new fever >100.5.     Final Clinical Impressions(s) / UC Diagnoses   Final diagnoses:  Laceration of finger, initial encounter      Eustace Moore, MD 08/20/16 1229

## 2016-08-17 NOTE — Discharge Instructions (Signed)
Wash wound gently with soap and water 1-2 times daily, apply antibiotic ointment and Band-Aid. Splint finger for a couple of days, to allow wound to set. Recheck for any increasing redness/swelling/drainage/pain, or new fever greater than 100.5.

## 2016-08-17 NOTE — ED Triage Notes (Signed)
Small laceration to left index finger, approx 1/4".

## 2017-11-09 IMAGING — CR DG CHEST 2V
1 series · 2 of 2 positions shown · non-contrast
Comparison: 10/31/2014

CLINICAL DATA: Cough for 3 weeks

EXAM:
CHEST  2 VIEW

[Series 1: dg chest 2 view · 0.14mm/px · 2 of 2 slices shown]
[im 1/2]
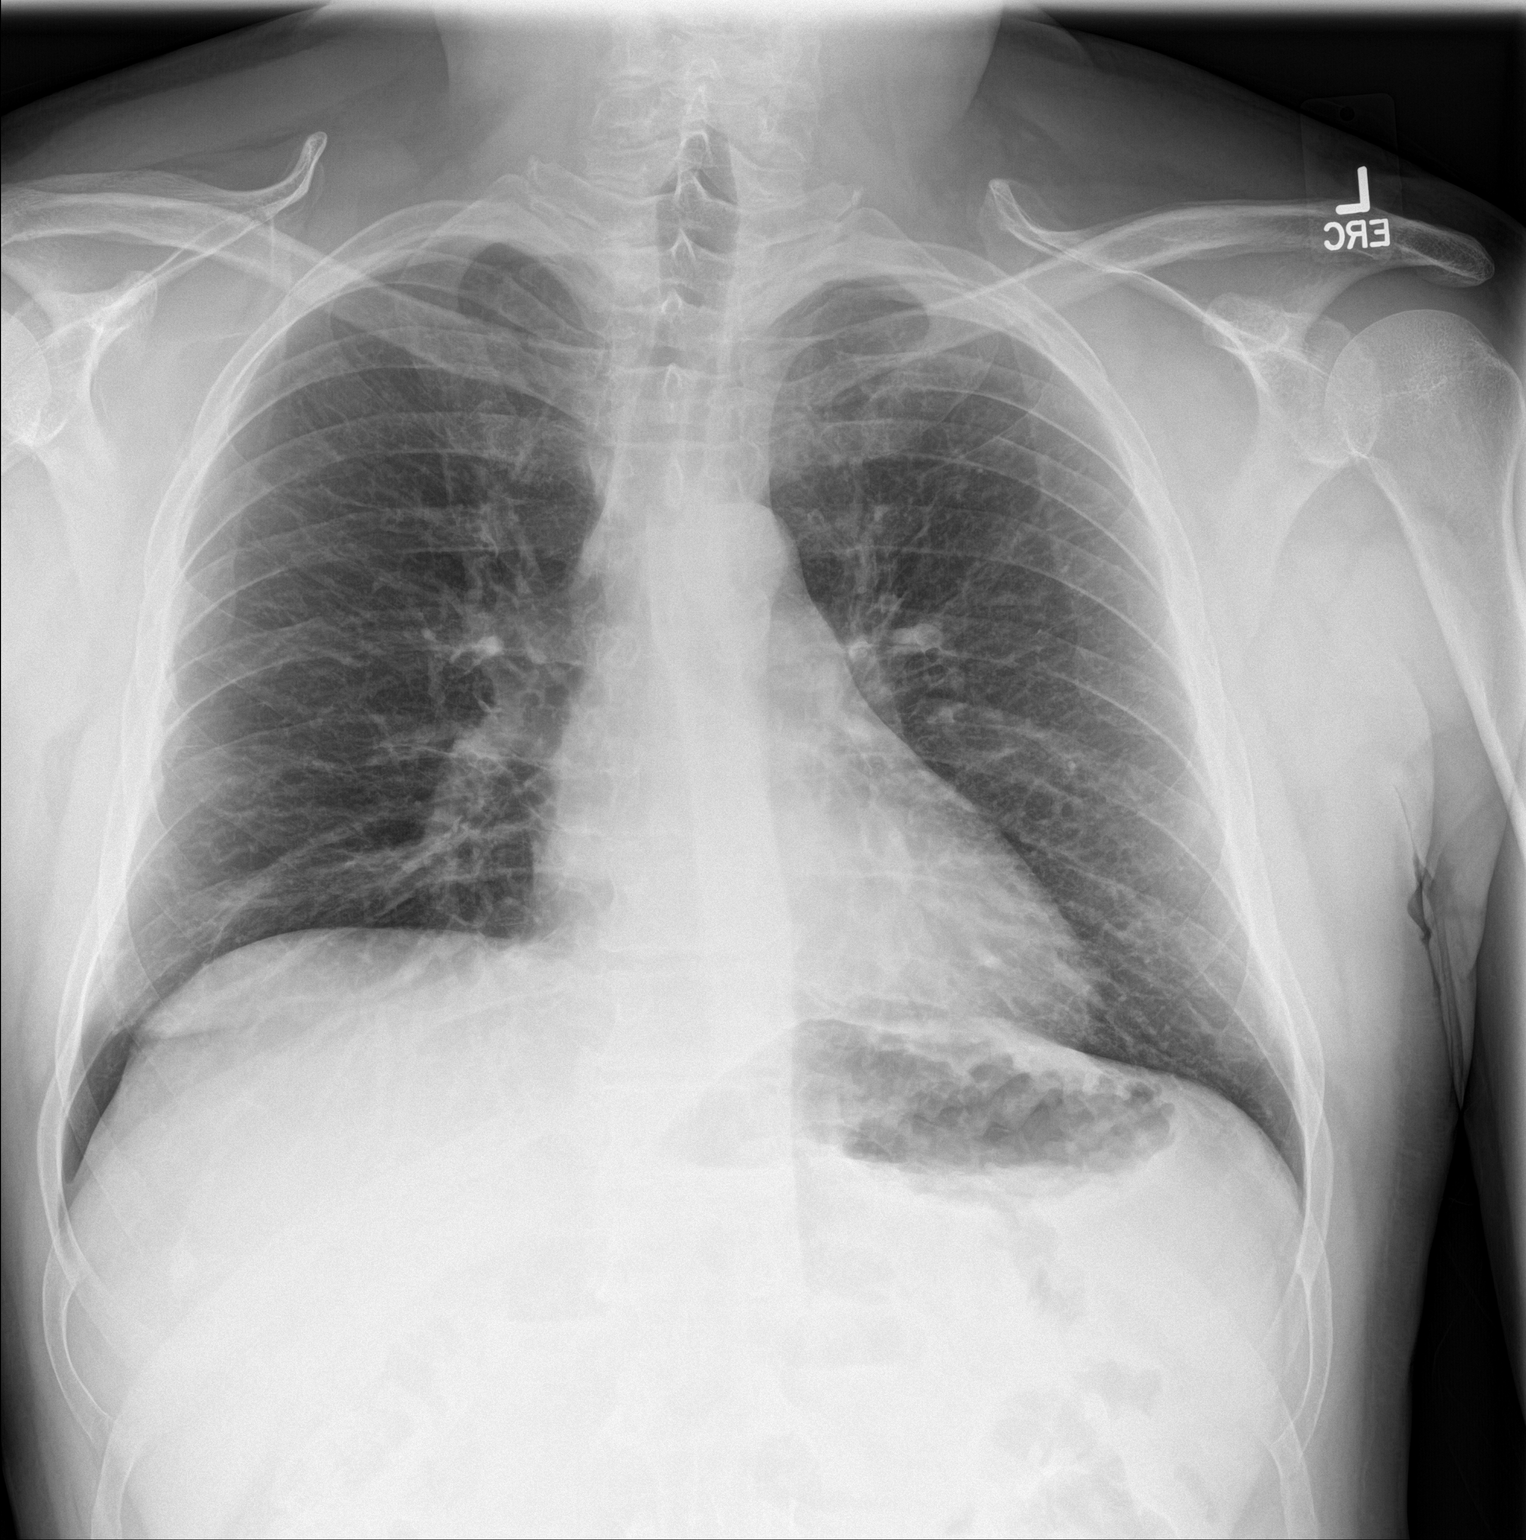
[im 2/2]
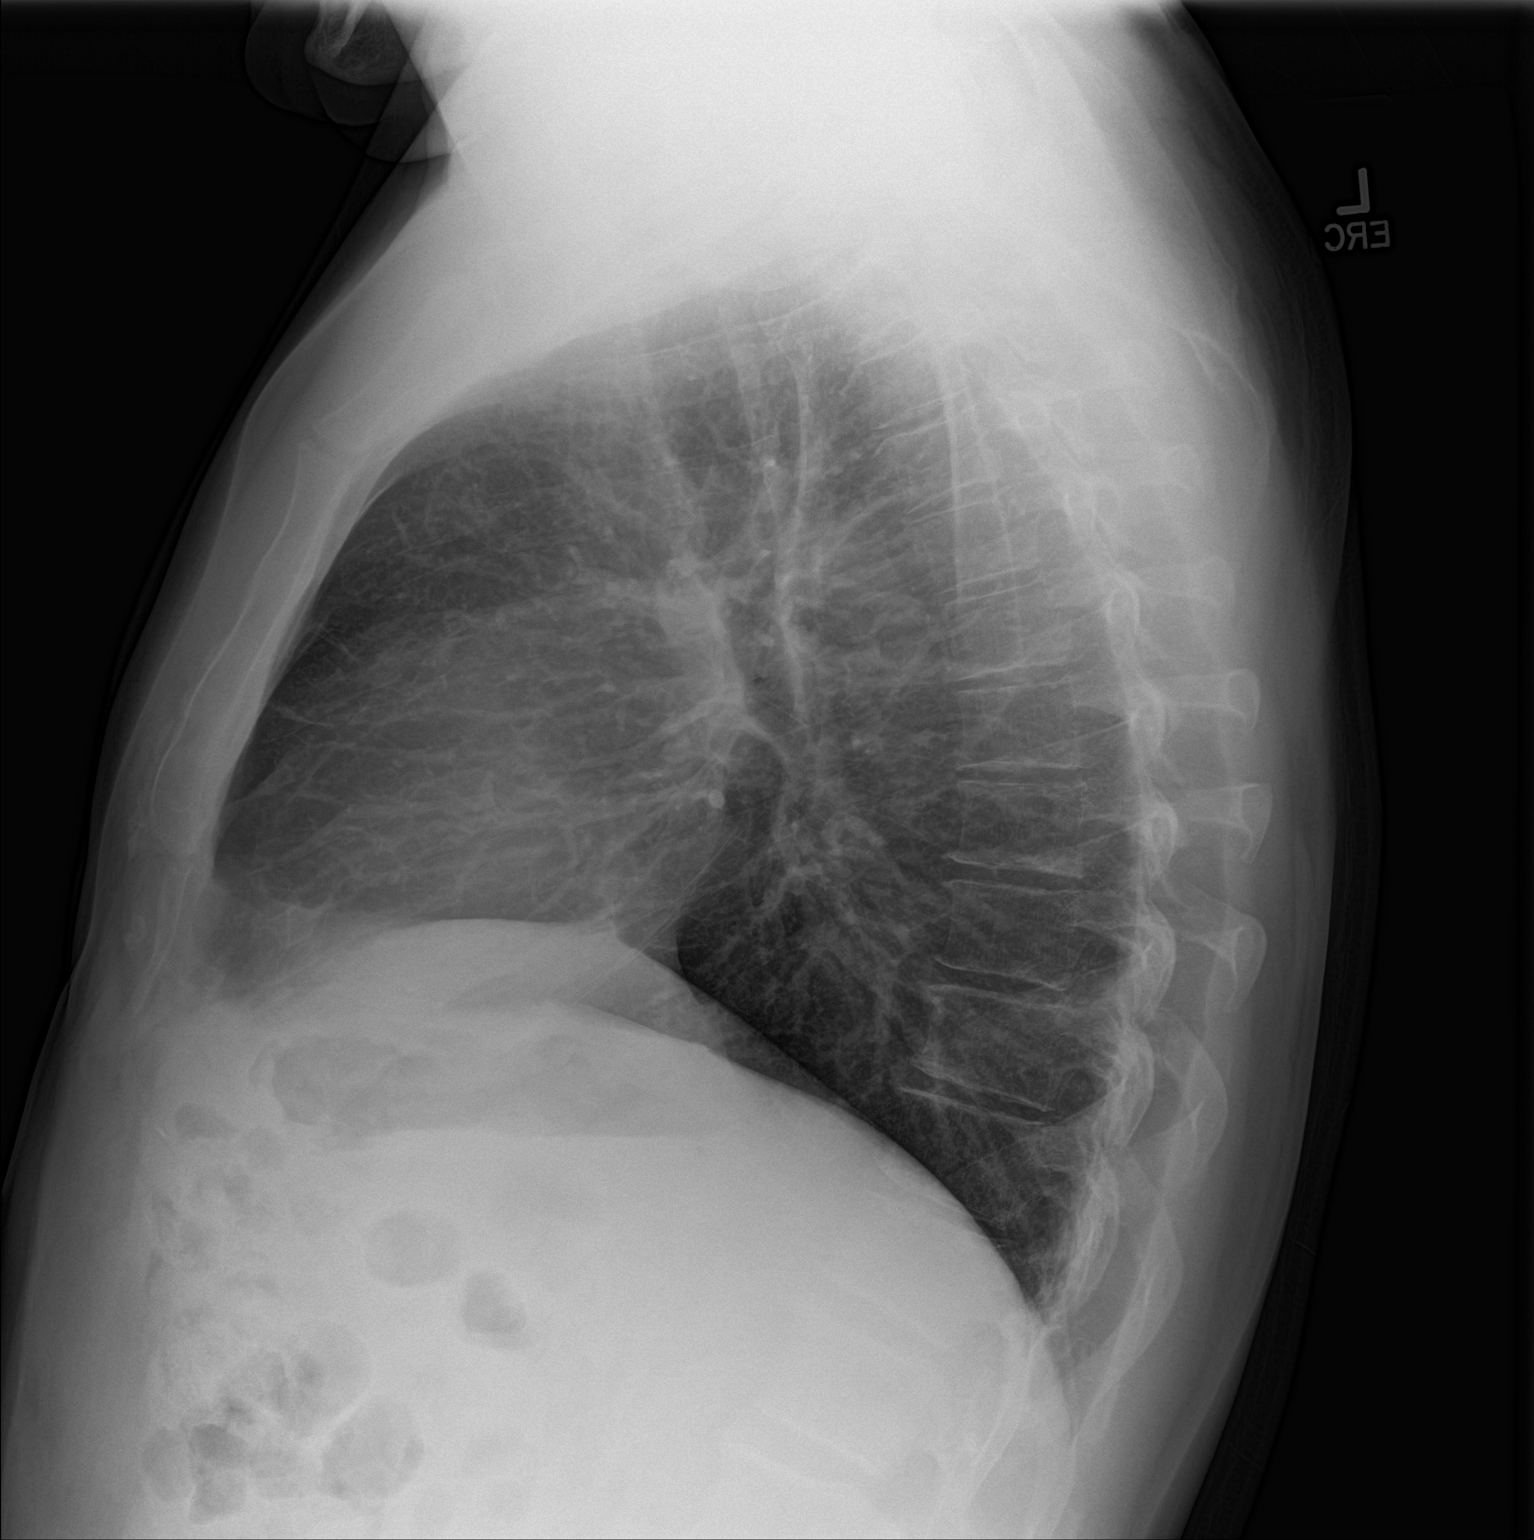

[2 of 2 positions shown; findings below may reference images not displayed]

FINDINGS: The heart size and vascular pattern are normal. Lungs are clear. No
pleural effusion.
IMPRESSION: No active cardiopulmonary disease.
